# Patient Record
Sex: Female | Born: 1950 | Race: Black or African American | Hispanic: No | Smoking: Never smoker
Health system: Southern US, Community
[De-identification: ages and names within clinical notes are randomized; demographics above are authoritative.]

## PROBLEM LIST (undated history)

## (undated) DIAGNOSIS — I10 Essential (primary) hypertension: Secondary | ICD-10-CM

## (undated) DIAGNOSIS — C50919 Malignant neoplasm of unspecified site of unspecified female breast: Secondary | ICD-10-CM

## (undated) HISTORY — PX: ABDOMINAL HYSTERECTOMY: SHX81

## (undated) HISTORY — PX: HAND SURGERY: SHX662

---

## 2017-01-04 ENCOUNTER — Emergency Department: Payer: Medicare Other

## 2017-01-04 ENCOUNTER — Inpatient Hospital Stay
Admission: EM | Admit: 2017-01-04 | Discharge: 2017-01-06 | DRG: 641 | Disposition: A | Discharge status: Home / Self Care | Payer: Medicare Other | Attending: Internal Medicine | Admitting: Internal Medicine

## 2017-01-04 ENCOUNTER — Inpatient Hospital Stay: Payer: Medicare Other

## 2017-01-04 ENCOUNTER — Encounter: Payer: Self-pay | Admitting: Emergency Medicine

## 2017-01-04 DIAGNOSIS — Z79899 Other long term (current) drug therapy: Secondary | ICD-10-CM | POA: Diagnosis not present

## 2017-01-04 DIAGNOSIS — I214 Non-ST elevation (NSTEMI) myocardial infarction: Secondary | ICD-10-CM | POA: Diagnosis present

## 2017-01-04 DIAGNOSIS — R197 Diarrhea, unspecified: Secondary | ICD-10-CM | POA: Diagnosis present

## 2017-01-04 DIAGNOSIS — N179 Acute kidney failure, unspecified: Secondary | ICD-10-CM | POA: Diagnosis present

## 2017-01-04 DIAGNOSIS — E860 Dehydration: Principal | ICD-10-CM | POA: Diagnosis present

## 2017-01-04 DIAGNOSIS — R778 Other specified abnormalities of plasma proteins: Secondary | ICD-10-CM

## 2017-01-04 DIAGNOSIS — R7989 Other specified abnormal findings of blood chemistry: Secondary | ICD-10-CM

## 2017-01-04 DIAGNOSIS — D72829 Elevated white blood cell count, unspecified: Secondary | ICD-10-CM | POA: Diagnosis present

## 2017-01-04 DIAGNOSIS — R55 Syncope and collapse: Secondary | ICD-10-CM

## 2017-01-04 DIAGNOSIS — R748 Abnormal levels of other serum enzymes: Secondary | ICD-10-CM | POA: Diagnosis not present

## 2017-01-04 DIAGNOSIS — C50919 Malignant neoplasm of unspecified site of unspecified female breast: Secondary | ICD-10-CM | POA: Diagnosis present

## 2017-01-04 DIAGNOSIS — Z7982 Long term (current) use of aspirin: Secondary | ICD-10-CM | POA: Diagnosis not present

## 2017-01-04 DIAGNOSIS — R079 Chest pain, unspecified: Secondary | ICD-10-CM

## 2017-01-04 DIAGNOSIS — I10 Essential (primary) hypertension: Secondary | ICD-10-CM | POA: Diagnosis present

## 2017-01-04 HISTORY — DX: Malignant neoplasm of unspecified site of unspecified female breast: C50.919

## 2017-01-04 HISTORY — DX: Essential (primary) hypertension: I10

## 2017-01-04 LAB — CBC
HCT: 28.4 % — ABNORMAL LOW (ref 35.0–47.0)
HEMOGLOBIN: 9.4 g/dL — AB (ref 12.0–16.0)
MCH: 30.4 pg (ref 26.0–34.0)
MCHC: 33 g/dL (ref 32.0–36.0)
MCV: 92 fL (ref 80.0–100.0)
Platelets: 148 10*3/uL — ABNORMAL LOW (ref 150–440)
RBC: 3.09 MIL/uL — ABNORMAL LOW (ref 3.80–5.20)
RDW: 20 % — AB (ref 11.5–14.5)
WBC: 22 10*3/uL — ABNORMAL HIGH (ref 3.6–11.0)

## 2017-01-04 LAB — COMPREHENSIVE METABOLIC PANEL
ALK PHOS: 95 U/L (ref 38–126)
ALT: 17 U/L (ref 14–54)
ANION GAP: 9 (ref 5–15)
AST: 24 U/L (ref 15–41)
Albumin: 3.4 g/dL — ABNORMAL LOW (ref 3.5–5.0)
BUN: 17 mg/dL (ref 6–20)
CALCIUM: 8.6 mg/dL — AB (ref 8.9–10.3)
CO2: 22 mmol/L (ref 22–32)
Chloride: 104 mmol/L (ref 101–111)
Creatinine, Ser: 1.47 mg/dL — ABNORMAL HIGH (ref 0.44–1.00)
GFR calc non Af Amer: 36 mL/min — ABNORMAL LOW (ref >60)
GFR, EST AFRICAN AMERICAN: 41 mL/min — AB (ref >60)
Glucose, Bld: 168 mg/dL — ABNORMAL HIGH (ref 65–99)
POTASSIUM: 4 mmol/L (ref 3.5–5.1)
SODIUM: 135 mmol/L (ref 135–145)
TOTAL PROTEIN: 6.8 g/dL (ref 6.5–8.1)
Total Bilirubin: 1 mg/dL (ref 0.3–1.2)

## 2017-01-04 LAB — LIPID PANEL
CHOLESTEROL: 144 mg/dL (ref 0–200)
HDL: 39 mg/dL — ABNORMAL LOW (ref >40)
LDL Cholesterol: 92 mg/dL (ref 0–99)
Total CHOL/HDL Ratio: 3.7 RATIO
Triglycerides: 63 mg/dL (ref <150)
VLDL: 13 mg/dL (ref 0–40)

## 2017-01-04 LAB — PROTIME-INR
INR: 1.19
Prothrombin Time: 15.2 seconds (ref 11.4–15.2)

## 2017-01-04 LAB — HEPARIN LEVEL (UNFRACTIONATED)
Heparin Unfractionated: 0.77 IU/mL — ABNORMAL HIGH (ref 0.30–0.70)
Heparin Unfractionated: 0.54 IU/mL (ref 0.30–0.70)

## 2017-01-04 LAB — APTT: aPTT: 143 seconds — ABNORMAL HIGH (ref 24–36)

## 2017-01-04 LAB — URINALYSIS, COMPLETE (UACMP) WITH MICROSCOPIC
BILIRUBIN URINE: NEGATIVE
Glucose, UA: NEGATIVE mg/dL
Ketones, ur: NEGATIVE mg/dL
Leukocytes, UA: NEGATIVE
NITRITE: NEGATIVE
PH: 6 (ref 5.0–8.0)
Protein, ur: NEGATIVE mg/dL
SPECIFIC GRAVITY, URINE: 1.002 — AB (ref 1.005–1.030)

## 2017-01-04 LAB — TROPONIN I
Troponin I: 0.27 ng/mL (ref <0.03)
Troponin I: <0.03 ng/mL (ref <0.03)
Troponin I: 0.06 ng/mL (ref <0.03)
Troponin I: <0.03 ng/mL (ref <0.03)

## 2017-01-04 LAB — TSH: TSH: 1.619 u[IU]/mL (ref 0.350–4.500)

## 2017-01-04 MED ORDER — ACETAMINOPHEN 650 MG RE SUPP: 650.0000 mg | Freq: Four times a day (QID) | RECTAL | Status: DC | PRN

## 2017-01-04 MED ORDER — ONDANSETRON HCL 4 MG/2ML IJ SOLN: 4.0000 mg | Freq: Four times a day (QID) | INTRAMUSCULAR | Status: DC | PRN

## 2017-01-04 MED ORDER — AMLODIPINE BESYLATE 5 MG PO TABS
5.0000 mg | ORAL_TABLET | Freq: Every day | ORAL | Status: DC
  Administered 2017-01-04 – 2017-01-06 (×3): 5 mg via ORAL
  Filled 2017-01-04 (×3): qty 1

## 2017-01-04 MED ORDER — DEXAMETHASONE 4 MG PO TABS
8.0000 mg | ORAL_TABLET | Freq: Two times a day (BID) | ORAL | Status: DC
  Administered 2017-01-04 – 2017-01-05 (×3): 8 mg via ORAL
  Filled 2017-01-04 (×4): qty 2

## 2017-01-04 MED ORDER — ACETAMINOPHEN 325 MG PO TABS: 650.0000 mg | ORAL_TABLET | Freq: Four times a day (QID) | ORAL | Status: DC | PRN

## 2017-01-04 MED ORDER — DOCUSATE SODIUM 100 MG PO CAPS
100.0000 mg | ORAL_CAPSULE | Freq: Two times a day (BID) | ORAL | Status: DC
  Administered 2017-01-05 (×2): 100 mg via ORAL
  Filled 2017-01-04 (×3): qty 1

## 2017-01-04 MED ORDER — HEPARIN BOLUS VIA INFUSION
4000.0000 [IU] | Freq: Once | INTRAVENOUS | Status: AC
  Administered 2017-01-04: 4000 [IU] via INTRAVENOUS
  Filled 2017-01-04: qty 4000

## 2017-01-04 MED ORDER — ATORVASTATIN CALCIUM 20 MG PO TABS
40.0000 mg | ORAL_TABLET | Freq: Every day | ORAL | Status: DC
  Administered 2017-01-04 – 2017-01-06 (×3): 40 mg via ORAL
  Filled 2017-01-04 (×3): qty 2

## 2017-01-04 MED ORDER — ASPIRIN EC 81 MG PO TBEC
81.0000 mg | DELAYED_RELEASE_TABLET | Freq: Every day | ORAL | Status: DC
Start: 2017-01-04 — End: 2017-01-07
  Administered 2017-01-04 – 2017-01-06 (×3): 81 mg via ORAL
  Filled 2017-01-04 (×3): qty 1

## 2017-01-04 MED ORDER — SODIUM CHLORIDE 0.9 % IV BOLUS (SEPSIS)
1000.0000 mL | Freq: Once | INTRAVENOUS | Status: AC
  Administered 2017-01-04: 1000 mL via INTRAVENOUS

## 2017-01-04 MED ORDER — LORAZEPAM 1 MG PO TABS: 1.0000 mg | ORAL_TABLET | Freq: Four times a day (QID) | ORAL | Status: DC | PRN

## 2017-01-04 MED ORDER — ONDANSETRON HCL 4 MG PO TABS: 4.0000 mg | ORAL_TABLET | Freq: Four times a day (QID) | ORAL | Status: DC | PRN

## 2017-01-04 MED ORDER — HYDROCODONE-ACETAMINOPHEN 5-325 MG PO TABS: 1.0000 | ORAL_TABLET | Freq: Four times a day (QID) | ORAL | Status: DC | PRN

## 2017-01-04 MED ORDER — SODIUM CHLORIDE 0.9 % IV SOLN
INTRAVENOUS | Status: DC
  Administered 2017-01-04 – 2017-01-05 (×3): via INTRAVENOUS

## 2017-01-04 MED ORDER — HEPARIN (PORCINE) IN NACL 100-0.45 UNIT/ML-% IJ SOLN
1100.0000 [IU]/h | INTRAMUSCULAR | Status: DC
  Administered 2017-01-04: 1100 [IU]/h via INTRAVENOUS
  Filled 2017-01-04: qty 250

## 2017-01-04 NOTE — H&P (Signed)
Traci Mckinney is an 66 y.o. female.    Chief Complaint: Loss of consciousness HPI: The patient with past medical history of hypertension who is currently undergoing treatment for breast cancer presents to the emergency department after a syncopal episode. The patient was sitting at her electronic gaming continue when she began to feel weak and lightheaded. The patient remembers that her vision became blurry but does not have recollection of events thereafter until she regained consciousness. She denies feeling chest pain, shortness of breath or palpitations. She also denies vomiting or diaphoresis but admits to feeling nauseous. In the emergency department the patient was found to have an elevated troponin which prompted the emergency department staff to call the hospitalist service for admission.  Past Medical History:  Diagnosis Date  . Breast CA ([place])   . HTN (hypertension)     Past Surgical History:  Procedure Laterality Date  . ABDOMINAL HYSTERECTOMY    . HAND SURGERY      Family History  Problem Relation Age of Onset  . Diabetes Mellitus II Mother   . Hypertension Father    Social History:  reports that she has never smoked. She has never used smokeless tobacco. She reports that she does not drink alcohol or use drugs.  Allergies: No Known Allergies  Medications Prior to Admission  Medication Sig Dispense Refill  . amLODipine (NORVASC) 5 MG tablet Take 5 mg by mouth daily.    Marland Kitchen dexamethasone (DECADRON) 4 MG tablet Take 8 mg by mouth 2 (two) times daily. The day before and day after chemotherapy    . HYDROcodone-acetaminophen (NORCO/VICODIN) 5-325 MG tablet Take 1 tablet by mouth every 6 (six) hours as needed for pain.    Marland Kitchen LORazepam (ATIVAN) 1 MG tablet Take 1 mg by mouth every 6 (six) hours as needed for nausea/vomiting or sleep.    Marland Kitchen ondansetron (ZOFRAN) 8 MG tablet Take 8 mg by mouth every 6 (six) hours as needed for nausea.    . prochlorperazine (COMPAZINE) 10 MG tablet Take  10 mg by mouth every 6 (six) hours as needed for nausea/vomiting.      Results for orders placed or performed during the hospital encounter of 01/04/17 (from the past 48 hour(s))  CBC     Status: Abnormal   Collection Time: 01/04/17  1:20 AM  Result Value Ref Range   WBC 22.0 (H) 3.6 - 11.0 K/uL   RBC 3.09 (L) 3.80 - 5.20 MIL/uL   Hemoglobin 9.4 (L) 12.0 - 16.0 g/dL   HCT 28.4 (L) 35.0 - 47.0 %   MCV 92.0 80.0 - 100.0 fL   MCH 30.4 26.0 - 34.0 pg   MCHC 33.0 32.0 - 36.0 g/dL   RDW 20.0 (H) 11.5 - 14.5 %   Platelets 148 (L) 150 - 440 K/uL    Comment: PLATELET COUNT CONFIRMED BY SMEAR  Comprehensive metabolic panel     Status: Abnormal   Collection Time: 01/04/17  1:20 AM  Result Value Ref Range   Sodium 135 135 - 145 mmol/L   Potassium 4.0 3.5 - 5.1 mmol/L    Comment: HEMOLYSIS AT THIS LEVEL MAY AFFECT RESULT   Chloride 104 101 - 111 mmol/L   CO2 22 22 - 32 mmol/L   Glucose, Bld 168 (H) 65 - 99 mg/dL   BUN 17 6 - 20 mg/dL   Creatinine, Ser 1.47 (H) 0.44 - 1.00 mg/dL   Calcium 8.6 (L) 8.9 - 10.3 mg/dL   Total Protein 6.8 6.5 -  8.1 g/dL   Albumin 3.4 (L) 3.5 - 5.0 g/dL   AST 24 15 - 41 U/L   ALT 17 14 - 54 U/L   Alkaline Phosphatase 95 38 - 126 U/L   Total Bilirubin 1.0 0.3 - 1.2 mg/dL   GFR calc non Af Amer 36 (L) >60 mL/min   GFR calc Af Amer 41 (L) >60 mL/min    Comment: (NOTE) The eGFR has been calculated using the CKD EPI equation. This calculation has not been validated in all clinical situations. eGFR's persistently <60 mL/min signify possible Chronic Kidney Disease.    Anion gap 9 5 - 15  Troponin I     Status: Abnormal   Collection Time: 01/04/17  1:20 AM  Result Value Ref Range   Troponin I 0.27 (HH) <0.03 ng/mL    Comment: CRITICAL RESULT CALLED TO, READ BACK BY AND VERIFIED WITH Eloise Harman RN AT 0220 01/04/17 MSS.   Urinalysis, Complete w Microscopic     Status: Abnormal   Collection Time: 01/04/17  3:43 AM  Result Value Ref Range   Color, Urine  COLORLESS (A) YELLOW   APPearance CLEAR (A) CLEAR   Specific Gravity, Urine 1.002 (L) 1.005 - 1.030   pH 6.0 5.0 - 8.0   Glucose, UA NEGATIVE NEGATIVE mg/dL   Hgb urine dipstick SMALL (A) NEGATIVE   Bilirubin Urine NEGATIVE NEGATIVE   Ketones, ur NEGATIVE NEGATIVE mg/dL   Protein, ur NEGATIVE NEGATIVE mg/dL   Nitrite NEGATIVE NEGATIVE   Leukocytes, UA NEGATIVE NEGATIVE   RBC / HPF 0-5 0 - 5 RBC/hpf   WBC, UA 0-5 0 - 5 WBC/hpf   Bacteria, UA RARE (A) NONE SEEN   Squamous Epithelial / LPF 0-5 (A) NONE SEEN  Troponin I     Status: None   Collection Time: 01/04/17  4:40 AM  Result Value Ref Range   Troponin I <0.03 <0.03 ng/mL  TSH     Status: None   Collection Time: 01/04/17  5:54 AM  Result Value Ref Range   TSH 1.619 0.350 - 4.500 uIU/mL    Comment: Performed by a 3rd Generation assay with a functional sensitivity of <=0.01 uIU/mL.  APTT     Status: Abnormal   Collection Time: 01/04/17  5:54 AM  Result Value Ref Range   aPTT 143 (H) 24 - 36 seconds    Comment:        IF BASELINE aPTT IS ELEVATED, SUGGEST PATIENT RISK ASSESSMENT BE USED TO DETERMINE APPROPRIATE ANTICOAGULANT THERAPY.   Protime-INR     Status: None   Collection Time: 01/04/17  5:54 AM  Result Value Ref Range   Prothrombin Time 15.2 11.4 - 15.2 seconds   INR 1.19   Heparin level (unfractionated)     Status: Abnormal   Collection Time: 01/04/17  5:54 AM  Result Value Ref Range   Heparin Unfractionated 0.77 (H) 0.30 - 0.70 IU/mL    Comment:        IF HEPARIN RESULTS ARE BELOW EXPECTED VALUES, AND PATIENT DOSAGE HAS BEEN CONFIRMED, SUGGEST FOLLOW UP TESTING OF ANTITHROMBIN III LEVELS.    Ct Head Wo Contrast  Result Date: 01/04/2017 CLINICAL DATA:  Initial evaluation for acute syncope. EXAM: CT HEAD WITHOUT CONTRAST TECHNIQUE: Contiguous axial images were obtained from the base of the skull through the vertex without intravenous contrast. COMPARISON:  None. FINDINGS: Brain: Cerebral volume within normal  limits for patient age. No evidence for acute intracranial hemorrhage. No findings to suggest acute large  vessel territory infarct. No mass lesion, midline shift, or mass effect. Ventricles are normal in size without evidence for hydrocephalus. No extra-axial fluid collection identified. Vascular: No hyperdense vessel identified. Skull: Scalp soft tissues demonstrate no acute abnormality.Calvarium intact. Sinuses/Orbits: Globes and orbital soft tissues are within normal limits. Visualized paranasal sinuses are clear. Trace left mastoid effusion noted. IMPRESSION: Normal head CT.  No acute intracranial process identified. Electronically Signed   By: Jeannine Boga M.D.   On: 01/04/2017 02:07    Review of Systems  Constitutional: Negative for chills and fever.  HENT: Negative for sore throat and tinnitus.   Eyes: Negative for blurred vision and redness.  Respiratory: Negative for cough and shortness of breath.   Cardiovascular: Negative for chest pain, palpitations, orthopnea and PND.  Gastrointestinal: Negative for abdominal pain, diarrhea, nausea and vomiting.  Genitourinary: Negative for dysuria, frequency and urgency.  Musculoskeletal: Negative for joint pain and myalgias.  Skin: Negative for rash.       No lesions  Neurological: Positive for weakness. Negative for speech change and focal weakness.  Endo/Heme/Allergies: Does not bruise/bleed easily.       No temperature intolerance  Psychiatric/Behavioral: Negative for depression and suicidal ideas.    Blood pressure 133/63, pulse 70, temperature 97.9 F (36.6 C), temperature source Oral, resp. rate 18, height 5' 6"  (1.676 m), weight 93.1 kg (205 lb 4.8 oz), SpO2 97 %. Physical Exam  Vitals reviewed. Constitutional: She is oriented to person, place, and time. She appears well-developed and well-nourished. No distress.  HENT:  Head: Normocephalic and atraumatic.  Eyes: Conjunctivae and EOM are normal. Pupils are equal, round, and  reactive to light. No scleral icterus.  Neck: Normal range of motion. Neck supple. No JVD present. No tracheal deviation present. No thyromegaly present.  Cardiovascular: Normal rate, regular rhythm and normal heart sounds.  Exam reveals no gallop and no friction rub.   No murmur heard. Respiratory: Effort normal and breath sounds normal.  GI: Soft. Bowel sounds are normal. She exhibits no distension. There is no tenderness.  Genitourinary:  Genitourinary Comments: Deferred  Musculoskeletal: Normal range of motion. She exhibits no edema.  Lymphadenopathy:    She has no cervical adenopathy.  Neurological: She is alert and oriented to person, place, and time. No cranial nerve deficit. She exhibits normal muscle tone.  Skin: Skin is warm and dry. No rash noted. No erythema.  Psychiatric: She has a normal mood and affect. Her behavior is normal. Judgment and thought content normal.     Assessment/Plan This is a 66 year old female admitted for NSTEMI. 1. NSTEMI: Elevated troponin. Continue follow cardiac biomarkers. Monitor telemetry. Cardiology has been consulted. 2. Acute kidney injury: Hydrate with intravenous fluid. Avoid nephrotoxic agents. 3. Essential hypertension: Controlled; continue amlodipine 4. Breast cancer: Undergoing chemotherapy; last treatment June 6. Includes dexamethasone. Explains leukocytosis. 5. DVT prophylaxis: Therapeutic heparin 6. GI prophylaxis: None The patient is a full code. Time spent on admission orders and patient care approximately 45 minutes  Harrie Foreman, MD 01/04/2017, 8:26 AM

## 2017-01-04 NOTE — ED Provider Notes (Signed)
[facility] Emergency Department Provider Note   ____________________________________________   First MD Initiated Contact with Patient 01/04/17 0105     (approximate)  I have reviewed the triage vital signs and the nursing notes.   HISTORY  Chief Complaint Loss of Consciousness    HPI Traci Mckinney is a 66 y.o. female who comes into the hospital today with a syncopal episode. EMS reports that the patient was at the [facility] and took a break. She sat in chair and seemed to drift off. The patient reports that she passed out. She states that she started feeling a little dizzy and was complaining of dizziness with EMS. The patient's blood sugar was 161. She is currently undergoing treatment for breast cancer with chemotherapy. The patient reports that she was just feeling weak and dizzy. She is here from [place]. The patient has been having diarrhea she reports for multiple weeks. The patient states it started after her last chemotherapy which was around June 6. The patient reports that she's had intermittent and varying amounts of diarrhea. She states that she has been drinking but not enough. Should the patient denies any vomiting, chest pain, shortness of breath. She's had some nausea and abdominal cramps. The patient feels lightheaded and denies any room spinning. She's also had some blurred vision without headache. The patient is here today for evaluation.The patient was sitting in a chair when she syncopized.   Past Medical History:  Diagnosis Date  . Breast CA ([place])   . HTN (hypertension)     Patient Active Problem List   Diagnosis Date Noted  . NSTEMI (non-ST elevated myocardial infarction) ([place]) 01/04/2017    Past Surgical History:  Procedure Laterality Date  . ABDOMINAL HYSTERECTOMY    . HAND SURGERY      Prior to Admission medications   Medication Sig Start Date End Date Taking? Authorizing Provider  amLODipine (NORVASC) 5 MG tablet Take  5 mg by mouth daily.   Yes [provider]  dexamethasone (DECADRON) 4 MG tablet Take 8 mg by mouth 2 (two) times daily. The day before and day after chemotherapy   Yes [provider]  HYDROcodone-acetaminophen (NORCO/VICODIN) 5-325 MG tablet Take 1 tablet by mouth every 6 (six) hours as needed for pain.   Yes [provider]  LORazepam (ATIVAN) 1 MG tablet Take 1 mg by mouth every 6 (six) hours as needed for nausea/vomiting or sleep.   Yes [provider]  ondansetron (ZOFRAN) 8 MG tablet Take 8 mg by mouth every 6 (six) hours as needed for nausea.   Yes [provider]  prochlorperazine (COMPAZINE) 10 MG tablet Take 10 mg by mouth every 6 (six) hours as needed for nausea/vomiting.   Yes [provider]    Allergies Patient has no known allergies.  No family history on file.  Social History Social History  Substance Use Topics  . Smoking status: Never Smoker  . Smokeless tobacco: Never Used  . Alcohol use No    Review of Systems  Constitutional: No fever/chills Eyes: Blurred vision ENT: No sore throat. Cardiovascular: Denies chest pain. Respiratory: Denies shortness of breath. Gastrointestinal: Diarrhea and  abdominal cramping, nausea, no vomiting.   Genitourinary: Negative for dysuria. Musculoskeletal: Negative for back pain. Skin: Negative for rash. Neurological: Syncope.   ____________________________________________   PHYSICAL EXAM:  VITAL SIGNS: ED Triage Vitals  Enc Vitals Group     BP 01/04/17 0117 (!) 94/58     Pulse Rate 01/04/17  0117 94     Resp 01/04/17 0117 18     Temp 01/04/17 0117 98.1 F (36.7 C)     Temp Source 01/04/17 0117 Oral     SpO2 01/04/17 0117 98 %     Weight 01/04/17 0119 208 lb (94.3 kg)     Height 01/04/17 0119 5\' 6"  (1.676 m)     Head Circumference --      Peak Flow --      Pain Score --      Pain Loc --      Pain Edu? --      Excl. in [place]? --     Constitutional: Alert and  oriented. Well appearing and in Mild distress. Eyes: Conjunctivae are normal. PERRL. EOMI. Head: Atraumatic. Nose: No congestion/rhinnorhea. Mouth/Throat: Mucous membranes are moist.  Oropharynx non-erythematous. Cardiovascular: Normal rate, regular rhythm. Grossly normal heart sounds.  Good peripheral circulation. Respiratory: Normal respiratory effort.  No retractions. Lungs CTAB. Gastrointestinal: Soft and nontender. No distention. Positive bowel sounds Musculoskeletal: No lower extremity tenderness nor edema.   Neurologic:  Normal speech and language. Cranial nerves II through XII are grossly intact with no focal motor or neuro deficits Skin:  Skin is warm, dry and intact.  Psychiatric: Mood and affect are normal.   ____________________________________________   LABS (all labs ordered are listed, but only abnormal results are displayed)  Labs Reviewed  CBC - Abnormal; Notable for the following:       Result Value   WBC 22.0 (*)    RBC 3.09 (*)    Hemoglobin 9.4 (*)    HCT 28.4 (*)    RDW 20.0 (*)    All other components within normal limits  COMPREHENSIVE METABOLIC PANEL - Abnormal; Notable for the following:    Glucose, Bld 168 (*)    Creatinine, Ser 1.47 (*)    Calcium 8.6 (*)    Albumin 3.4 (*)    GFR calc non Af Amer 36 (*)    GFR calc Af Amer 41 (*)    All other components within normal limits  TROPONIN I - Abnormal; Notable for the following:    Troponin I 0.27 (*)    All other components within normal limits  URINALYSIS, COMPLETE (UACMP) WITH MICROSCOPIC   ____________________________________________  EKG  ED ECG REPORT I, Loney Hering, the attending physician, personally viewed and interpreted this ECG.   Date: 01/04/2017  EKG Time: 122  Rate: 90  Rhythm: normal sinus rhythm  Axis: normal  Intervals:none  ST&T Change: flipped t waves in lead avl  ____________________________________________  RADIOLOGY  Ct Head Wo Contrast  Result Date:  01/04/2017 CLINICAL DATA:  Initial evaluation for acute syncope. EXAM: CT HEAD WITHOUT CONTRAST TECHNIQUE: Contiguous axial images were obtained from the base of the skull through the vertex without intravenous contrast. COMPARISON:  None. FINDINGS: Brain: Cerebral volume within normal limits for patient age. No evidence for acute intracranial hemorrhage. No findings to suggest acute large vessel territory infarct. No mass lesion, midline shift, or mass effect. Ventricles are normal in size without evidence for hydrocephalus. No extra-axial fluid collection identified. Vascular: No hyperdense vessel identified. Skull: Scalp soft tissues demonstrate no acute abnormality.Calvarium intact. Sinuses/Orbits: Globes and orbital soft tissues are within normal limits. Visualized paranasal sinuses are clear. Trace left mastoid effusion noted. IMPRESSION: Normal head CT.  No acute intracranial process identified. Electronically Signed   By: Jeannine Boga M.D.   On: 01/04/2017 02:07    ____________________________________________  PROCEDURES  Procedure(s) performed: None  Procedures  Critical Care performed: No  ____________________________________________   INITIAL IMPRESSION / ASSESSMENT AND PLAN / ED COURSE  Pertinent labs & imaging results that were available during my care of the patient were reviewed by me and considered in my medical decision making (see chart for details).  This is a 66 year old female who comes into the hospital today with syncope. The patient reports that she has been having some diarrhea due to her chemotherapy. She was not orthostatic. I will give the patient a liter of normal saline for further evaluation and I will await the results of her blood work and imaging studies.     Patient's CT scan is unremarkable but her white blood cell count is elevated and her troponin is 0.27. Given the patient's syncopal event I will admit her to the hospitalist service for  further evaluation of a cardiac cause of her syncope. The patient did receive a liter of normal saline. She did also discuss with the nurse that she had some shoulder cramping before she passed out. ____________________________________________   FINAL CLINICAL IMPRESSION(S) / ED DIAGNOSES  Final diagnoses:  Syncope, unspecified syncope type  Elevated troponin      NEW MEDICATIONS STARTED DURING THIS VISIT:  New Prescriptions   No medications on file     Note:  This document was prepared using Dragon voice recognition software and may include unintentional dictation errors.    Loney Hering, MD 01/04/17 [phone]

## 2017-01-04 NOTE — Plan of Care (Signed)
Problem: Education: Goal: Knowledge of [facility] Education information/materials will improve Outcome: Completed/Met Date Met: 01/04/17 Pt admitted from ED with syncopal episode and positive troponin. Tele box number 28, verified by Gaetano Net, NT. Skin intact, just unaccessed port to right upper chest, skin verified by Levester Fresh, RN. No complaints of pain. NS infusing @ 125 & heparin gtt infusing @ 11.   Problem: Pain Managment: Goal: General experience of comfort will improve Outcome: Progressing No complaints of pain since pt was brought up from the ED, will continue to monitor.  Problem: Bowel/Gastric: Goal: Will not experience complications related to bowel motility Outcome: Progressing Pt with multiple BM's this AM, pt states this is normal for her since she started her chemo.

## 2017-01-04 NOTE — Progress Notes (Signed)
Back from xray

## 2017-01-04 NOTE — Plan of Care (Signed)
Problem: Pain Managment: Goal: General experience of comfort will improve Outcome: Progressing No complaints of pain this shift

## 2017-01-04 NOTE — Progress Notes (Signed)
ANTICOAGULATION CONSULT NOTE - Initial Consult  Pharmacy Consult for heparin dosing Indication: chest pain/ACS  No Known Allergies  Patient Measurements: Height: 5\' 6"  (167.6 cm) Weight: 208 lb (94.3 kg) IBW/kg (Calculated) : 59.3 Heparin Dosing Weight: 94.3 kg  Vital Signs: Temp: 97.9 F (36.6 C) (06/19 0459) Temp Source: Oral (06/19 0459) BP: 133/63 (06/19 0459) Pulse Rate: 70 (06/19 0459)  Labs:  Recent Labs  01/04/17 0120 01/04/17 0440  HGB 9.4*  --   HCT 28.4*  --   PLT 148*  --   CREATININE 1.47*  --   TROPONINI 0.27* <0.03    Estimated Creatinine Clearance: 43 mL/min (A) (by C-G formula based on SCr of 1.47 mg/dL (H)).   Medical History: Past Medical History:  Diagnosis Date  . Breast CA ([place])   . HTN (hypertension)     Medications:  Scheduled:  . amLODipine  5 mg Oral Daily  . dexamethasone  8 mg Oral BID  . docusate sodium  100 mg Oral BID  . heparin  4,000 Units Intravenous Once    Assessment: Pt. Admitted for syncope and found to have trops up to 0.27, second trop < 0.03. Starting heparin drip  Goal of Therapy:  Heparin level 0.3-0.7 units/ml Monitor platelets by anticoagulation protocol: Yes   Plan:  Give 4000 units bolus x 1  Will start heparin drip @ 1100 units/hr rate. Will check HL @ 1330 6/19. Baseline labs ordered.  Tobie Lords, PharmD, BCPS Clinical Pharmacist 01/04/2017

## 2017-01-04 NOTE — Care Management (Addendum)
Spoke with patient regarding discharge planning and PCP. She states that she has Medicaid and a Physiological scientist. She states her physician is [place] Tye [place] of [place] is [place] she's from. She receives her breast cancer treatment from [facility].She is here with her best friend - Mingo Amber which will provide transportation to home when discharged. Patient denies RNCM needs.

## 2017-01-04 NOTE — Consult Note (Signed)
[facility] Cardiology  CARDIOLOGY CONSULT NOTE  Patient ID: Traci Mckinney MRN: [id number] DOB/AGE: 11-05-1950 66 y.o.  Admit date: 01/04/2017 Referring Physician [facility] Primary Cardiologist None per patient Reason for Consultation Elevated troponin  HPI: 66 year old female referred for elevated troponin. The patient has a history of hypertension and breast cancer, currently undergoing chemotherapy, most recent treatment was on June 6. The patient reports being in her normal state of health late last night while at a sweepstakes center, and suddenly began to feel weak and had blurry vision. She is unsure if she completely lost consciousness, but states the episode lasted only about 1 minute. She denies experiencing chest pain, shortness of breath, palpitations, heart racing, nausea, or diaphoresis. She states that while the paramedics were carrying her on the stretcher, she developed a cramp under her left scapula, which resolved shortly after moving from the stretcher. Admission labs notable for borderline elevated troponin of 0.27, followed by <0.03, hemoglobin 9.4, hematocrit 28.4, platelets 148000, creatinine 1.47, and GFR 41. ECG revealed normal sinus rhythm at a rate of 90 bpm with minimal ST elevation in inferior leads. The patient states she had a similar episode in March of this year, admitted to [facility], [facility], [facility], and the episode was felt to be due to vasovagal syncope or dehydration. Recent echocardiogram on 10/19/16 revealed normal left ventricular function with LVEF 55-60%. Currently, the patient reports feeling better. She denies chest pain, shortness of breath, palpitations, heart racing, lightheadedness, or dizziness. She denies a history of prior MI, cardiac catheterization, arrhythmia, valvular heart disease, or stroke.  Review of systems complete and found to be negative unless listed above     Past Medical History:  Diagnosis Date  . Breast CA ([place])   . HTN (hypertension)      Past Surgical History:  Procedure Laterality Date  . ABDOMINAL HYSTERECTOMY    . HAND SURGERY      Prescriptions Prior to Admission  Medication Sig Dispense Refill Last Dose  . amLODipine (NORVASC) 5 MG tablet Take 5 mg by mouth daily.   01/03/2017  . dexamethasone (DECADRON) 4 MG tablet Take 8 mg by mouth 2 (two) times daily. The day before and day after chemotherapy   01/03/2017  . HYDROcodone-acetaminophen (NORCO/VICODIN) 5-325 MG tablet Take 1 tablet by mouth every 6 (six) hours as needed for pain.   01/03/2017  . LORazepam (ATIVAN) 1 MG tablet Take 1 mg by mouth every 6 (six) hours as needed for nausea/vomiting or sleep.   01/03/2017  . ondansetron (ZOFRAN) 8 MG tablet Take 8 mg by mouth every 6 (six) hours as needed for nausea.   01/03/2017  . prochlorperazine (COMPAZINE) 10 MG tablet Take 10 mg by mouth every 6 (six) hours as needed for nausea/vomiting.   01/03/2017   Social History   Social History  . Marital status: Unknown    Spouse name: N/A  . Number of children: N/A  . Years of education: N/A   Occupational History  . Not on file.   Social History Main Topics  . Smoking status: Never Smoker  . Smokeless tobacco: Never Used  . Alcohol use No  . Drug use: No  . Sexual activity: Not on file   Other Topics Concern  . Not on file   Social History Narrative  . No narrative on file    Family History  Problem Relation Age of Onset  . Diabetes Mellitus II Mother   . Hypertension Father       Review  of systems complete and found to be negative unless listed above      PHYSICAL EXAM  General: Well developed, well nourished, in no acute distress HEENT:  Normocephalic and atramatic Neck:  No JVD.  Lungs: Clear bilaterally to auscultation, normal effort of breathing Heart: HRRR . Normal S1 and S2 without gallops or murmurs.  Abdomen: Bowel sounds are positive, abdomen soft and non-tender  Msk:  Back normal, able to sit upright in bed without  difficulty. Extremities: No clubbing, cyanosis or edema.   Neuro: Alert and oriented X 3. Psych:  Good affect, responds appropriately  Labs:   Lab Results  Component Value Date   WBC 22.0 (H) 01/04/2017   HGB 9.4 (L) 01/04/2017   HCT 28.4 (L) 01/04/2017   MCV 92.0 01/04/2017   PLT 148 (L) 01/04/2017    Recent Labs Lab 01/04/17 0120  NA 135  K 4.0  CL 104  CO2 22  BUN 17  CREATININE 1.47*  CALCIUM 8.6*  PROT 6.8  BILITOT 1.0  ALKPHOS 95  ALT 17  AST 24  GLUCOSE 168*   Lab Results  Component Value Date   TROPONINI <0.03 01/04/2017   No results found for: CHOL No results found for: HDL No results found for: LDLCALC No results found for: TRIG No results found for: CHOLHDL No results found for: LDLDIRECT    Radiology: Dg Chest 2 View  Result Date: 01/04/2017 CLINICAL DATA:  Syncopal episode this morning. Patient is undergoing chemotherapy for breast malignancy. History of NSTEMI and hypertension. Nonsmoker. EXAM: CHEST  2 VIEW COMPARISON:  PET-CT study of November 08, 2016 and chest x-ray of September 17, 2016 FINDINGS: The lungs are adequately inflated. There are coarse lung markings at the left lung base. There is no pleural effusion or pneumothorax. The heart is top-normal in size. The pulmonary vascularity is normal. There is tortuosity of the ascending and descending thoracic aorta. The power port catheter tip projects over the proximal third of the SVC. There is multilevel degenerative disc disease with bridging endplate osteophytes of the mid and lower thoracic spine. IMPRESSION: There is no acute pneumonia nor CHF. I cannot exclude minimal atelectasis at the left lung base. Thoracic aortic atherosclerosis. Electronically Signed   By: David  Martinique M.D.   On: 01/04/2017 08:37   Ct Head Wo Contrast  Result Date: 01/04/2017 CLINICAL DATA:  Initial evaluation for acute syncope. EXAM: CT HEAD WITHOUT CONTRAST TECHNIQUE: Contiguous axial images were obtained from the base of the  skull through the vertex without intravenous contrast. COMPARISON:  None. FINDINGS: Brain: Cerebral volume within normal limits for patient age. No evidence for acute intracranial hemorrhage. No findings to suggest acute large vessel territory infarct. No mass lesion, midline shift, or mass effect. Ventricles are normal in size without evidence for hydrocephalus. No extra-axial fluid collection identified. Vascular: No hyperdense vessel identified. Skull: Scalp soft tissues demonstrate no acute abnormality.Calvarium intact. Sinuses/Orbits: Globes and orbital soft tissues are within normal limits. Visualized paranasal sinuses are clear. Trace left mastoid effusion noted. IMPRESSION: Normal head CT.  No acute intracranial process identified. Electronically Signed   By: Jeannine Boga M.D.   On: 01/04/2017 02:07    EKG: Normal sinus rhythm, 60 bpm  ASSESSMENT AND PLAN:  1. Syncope, with prodromal weakness and blurred vision, with similar episode 09/2016, felt to be due to dehydration vs. Vasovagal. Recent echocardiogram on 10/19/16 revealed normal left ventricular function with LVEF 55-60%. 2. Borderline elevated initial troponin of 0.27, followed by <0.03,  then 0.06, in the absence of chest pain, with minimal ST elevation in inferior leads, which is changed from prior ECG, no history of CAD. 3. Breast cancer, currently undergoing chemotherapy, last treatment on June 6  Recommendations: 1. Continue current therapy. 2. Continue to cycle cardiac enzymes and monitor on telemetry 3. Nuclear stress test in the morning 4. Discontinue heparin 5.  Further recommendations pending patient's initial course    Signed: Clabe Seal, Hershal Coria 01/04/2017, 10:07 AM

## 2017-01-04 NOTE — ED Triage Notes (Signed)
Pt presents to ED via EMS from a sweepstakes building where she had been gambling. Pt states she had sudden onset of dizziness and sat down. Once sitting pt was said to have had a syncopal episode for a few seconds. undergoing tx for breast CA currently and reports having frequent diarrhea since her last chemo which was Friday. Pt eyes closed during triage but answering questions appropriately. Only pain is "cramp" in her "left shoulder blade". Denies nausea or sob.

## 2017-01-04 NOTE — Progress Notes (Signed)
To xray via bed 

## 2017-01-04 NOTE — ED Notes (Signed)
Pt to CT

## 2017-01-04 NOTE — Progress Notes (Signed)
[place] at [place] NAME: Traci Mckinney    MR#:  [id number]  DATE OF BIRTH:  [birth year]  SUBJECTIVE:  CHIEF COMPLAINT:   Chief Complaint  Patient presents with  . Loss of Consciousness    History of breast cancer on chemotherapy, came with left-sided chest pain troponin was slightly elevated so admitted with IV heparin drip. On further follow-up her troponin remains stable. Still have some tightness, mostly muscular type of pain on the left side of back pain.  REVIEW OF SYSTEMS:  CONSTITUTIONAL: No fever, fatigue or weakness.  EYES: No blurred or double vision.  EARS, NOSE, AND THROAT: No tinnitus or ear pain.  RESPIRATORY: No cough, shortness of breath, wheezing or hemoptysis.  CARDIOVASCULAR: No chest pain, orthopnea, edema.  GASTROINTESTINAL: No nausea, vomiting, diarrhea or abdominal pain.  GENITOURINARY: No dysuria, hematuria.  ENDOCRINE: No polyuria, nocturia,  HEMATOLOGY: No anemia, easy bruising or bleeding SKIN: No rash or lesion. MUSCULOSKELETAL: No joint pain or arthritis.   NEUROLOGIC: No tingling, numbness, weakness.  PSYCHIATRY: No anxiety or depression.   ROS  DRUG ALLERGIES:  No Known Allergies  VITALS:  Blood pressure 127/62, pulse 92, temperature (!) 100.9 F (38.3 C), temperature source Oral, resp. rate 18, height 5\' 6"  (1.676 m), weight 93.1 kg (205 lb 4.8 oz), SpO2 93 %.  PHYSICAL EXAMINATION:  GENERAL:  66 y.o.-year-old patient lying in the bed with no acute distress.  EYES: Pupils equal, round, reactive to light and accommodation. No scleral icterus. Extraocular muscles intact.  HEENT: Head atraumatic, normocephalic. Oropharynx and nasopharynx clear.  NECK:  Supple, no jugular venous distention. No thyroid enlargement, no tenderness.  LUNGS: Normal breath sounds bilaterally, no wheezing, rales,rhonchi or crepitation. No use of accessory muscles of respiration.  CARDIOVASCULAR: S1, S2 normal. No murmurs, rubs,  or gallops.  ABDOMEN: Soft, nontender, nondistended. Bowel sounds present. No organomegaly or mass.  EXTREMITIES: No pedal edema, cyanosis, or clubbing.  NEUROLOGIC: Cranial nerves II through XII are intact. Muscle strength 5/5 in all extremities. Sensation intact. Gait not checked.  PSYCHIATRIC: The patient is alert and oriented x 3.  SKIN: No obvious rash, lesion, or ulcer.   Physical Exam LABORATORY PANEL:   CBC  Recent Labs Lab 01/04/17 0120  WBC 22.0*  HGB 9.4*  HCT 28.4*  PLT 148*   ------------------------------------------------------------------------------------------------------------------  Chemistries   Recent Labs Lab 01/04/17 0120  NA 135  K 4.0  CL 104  CO2 22  GLUCOSE 168*  BUN 17  CREATININE 1.47*  CALCIUM 8.6*  AST 24  ALT 17  ALKPHOS 95  BILITOT 1.0   ------------------------------------------------------------------------------------------------------------------  Cardiac Enzymes  Recent Labs Lab 01/04/17 0440 01/04/17 1038  TROPONINI <0.03 0.06*   ------------------------------------------------------------------------------------------------------------------  RADIOLOGY:  Dg Chest 2 View  Result Date: 01/04/2017 CLINICAL DATA:  Syncopal episode this morning. Patient is undergoing chemotherapy for breast malignancy. History of NSTEMI and hypertension. Nonsmoker. EXAM: CHEST  2 VIEW COMPARISON:  PET-CT study of November 08, 2016 and chest x-ray of September 17, 2016 FINDINGS: The lungs are adequately inflated. There are coarse lung markings at the left lung base. There is no pleural effusion or pneumothorax. The heart is top-normal in size. The pulmonary vascularity is normal. There is tortuosity of the ascending and descending thoracic aorta. The power port catheter tip projects over the proximal third of the SVC. There is multilevel degenerative disc disease with bridging endplate osteophytes of the mid and lower thoracic spine. IMPRESSION: There  is no  acute pneumonia nor CHF. I cannot exclude minimal atelectasis at the left lung base. Thoracic aortic atherosclerosis. Electronically Signed   By: David  Martinique M.D.   On: 01/04/2017 08:37   Ct Head Wo Contrast  Result Date: 01/04/2017 CLINICAL DATA:  Initial evaluation for acute syncope. EXAM: CT HEAD WITHOUT CONTRAST TECHNIQUE: Contiguous axial images were obtained from the base of the skull through the vertex without intravenous contrast. COMPARISON:  None. FINDINGS: Brain: Cerebral volume within normal limits for patient age. No evidence for acute intracranial hemorrhage. No findings to suggest acute large vessel territory infarct. No mass lesion, midline shift, or mass effect. Ventricles are normal in size without evidence for hydrocephalus. No extra-axial fluid collection identified. Vascular: No hyperdense vessel identified. Skull: Scalp soft tissues demonstrate no acute abnormality.Calvarium intact. Sinuses/Orbits: Globes and orbital soft tissues are within normal limits. Visualized paranasal sinuses are clear. Trace left mastoid effusion noted. IMPRESSION: Normal head CT.  No acute intracranial process identified. Electronically Signed   By: Jeannine Boga M.D.   On: 01/04/2017 02:07    ASSESSMENT AND PLAN:   Active Problems:   NSTEMI (non-ST elevated myocardial infarction) (HCC)  * Non-ST elevation MI- 3 suspected on admission but likely it is ruled out as 3 troponin consecutively is not very high.   Currently on IV heparin drip and cardiology consult was called in, I will wait for them to follow on the patient.   Meanwhile check the peak panel and start on aspirin and atorvastatin.  *  Acute kidney injury: Hydrate with intravenous fluid. Avoid nephrotoxic agents. * Essential hypertension: Controlled; continue amlodipine * Breast cancer: Undergoing chemotherapy; last treatment June 6. Includes dexamethasone. Explains leukocytosis. * DVT prophylaxis: Therapeutic heparin * GI  prophylaxis: None   All the records are reviewed and case discussed with Care Management/Social Workerr. Management plans discussed with the patient, family and they are in agreement.  CODE STATUS: Full.  TOTAL TIME TAKING CARE OF THIS PATIENT: 35 minutes.    POSSIBLE D/C IN 1-2 DAYS, DEPENDING ON CLINICAL CONDITION.   Vaughan Basta M.D on 01/04/2017   Between 7am to 6pm - Pager - [phone]  After 6pm go to [email or web address] - password [email or web address] [facility]  Office  [phone]  CC: Primary care physician; System, Pcp Not In  Note: This dictation was prepared with Dragon dictation along with smaller phrase technology. Any transcriptional errors that result from this process are unintentional.

## 2017-01-04 NOTE — Progress Notes (Signed)
   01/04/17 1115  Clinical Encounter Type  Visited With Patient  Visit Type Initial  Referral From Other (Comment) (morning rounds)   Chaplain knocked and entered room. Patient was eating. Chaplain will visit another time.

## 2017-01-04 NOTE — Progress Notes (Signed)
ANTICOAGULATION CONSULT NOTE - Initial Consult  Pharmacy Consult for heparin dosing Indication: chest pain/ACS  No Known Allergies  Patient Measurements: Height: 5\' 6"  (167.6 cm) Weight: 205 lb 4.8 oz (93.1 kg) IBW/kg (Calculated) : 59.3 Heparin Dosing Weight: 94.3 kg  Vital Signs: Temp: 100.9 F (38.3 C) (06/19 1207) Temp Source: Oral (06/19 1207) BP: 127/62 (06/19 1207) Pulse Rate: 92 (06/19 1207)  Labs:  Recent Labs  01/04/17 0120 01/04/17 0440 01/04/17 0554 01/04/17 1038 01/04/17 1335  HGB 9.4*  --   --   --   --   HCT 28.4*  --   --   --   --   PLT 148*  --   --   --   --   APTT  --   --  143*  --   --   LABPROT  --   --  15.2  --   --   INR  --   --  1.19  --   --   HEPARINUNFRC  --   --  0.77*  --  0.54  CREATININE 1.47*  --   --   --   --   TROPONINI 0.27* <0.03  --  0.06*  --     Estimated Creatinine Clearance: 42.7 mL/min (A) (by C-G formula based on SCr of 1.47 mg/dL (H)).   Medical History: Past Medical History:  Diagnosis Date  . Breast CA ([place])   . HTN (hypertension)     Medications:  Scheduled:  . amLODipine  5 mg Oral Daily  . aspirin EC  81 mg Oral Daily  . atorvastatin  40 mg Oral q1800  . dexamethasone  8 mg Oral BID  . docusate sodium  100 mg Oral BID    Assessment: Pt. Admitted for syncope and found to have trops up to 0.27, second trop < 0.03. Starting heparin drip  Goal of Therapy:  Heparin level 0.3-0.7 units/ml Monitor platelets by anticoagulation protocol: Yes   Plan:  6/19 1335 HL 0.54 Will continue current rate of 1100 units/hr. Will recheck HL in 6 hours.

## 2017-01-04 NOTE — ED Notes (Addendum)
Pt ambulated without difficulty with stand by assist to in rm bathroom. RN remained in room for precautions.

## 2017-01-04 NOTE — ED Notes (Addendum)
Pt states she needs to use the bathroom again, pt states since she started chemo she has to use the bathroom often due to diarrhea. Pt is stand by assist to bathroom in rm  without difficulty. RN remained in rm for precaution.

## 2017-01-05 ENCOUNTER — Encounter: Payer: Self-pay | Admitting: Radiology

## 2017-01-05 ENCOUNTER — Ambulatory Visit: Payer: Medicare Other

## 2017-01-05 LAB — NM MYOCAR MULTI W/SPECT W/WALL MOTION / EF
CSEPED: 3 min
CSEPHR: 92 %
Estimated workload: 4.6 METS
Exercise duration (sec): 45 s
LV sys vol: 45 mL
LVDIAVOL: 112 mL (ref 46–106)
MPHR: 153 {beats}/min
Peak HR: 141 {beats}/min
Rest HR: 68 {beats}/min
SDS: 2
SRS: 4
SSS: 8
TID: 1.07

## 2017-01-05 LAB — HEMOGLOBIN A1C
Hgb A1c MFr Bld: 6.5 % — ABNORMAL HIGH (ref 4.8–5.6)
Mean Plasma Glucose: 140 mg/dL

## 2017-01-05 MED ORDER — ENOXAPARIN SODIUM 40 MG/0.4ML ~~LOC~~ SOLN
40.0000 mg | SUBCUTANEOUS | Status: DC
  Administered 2017-01-05: 40 mg via SUBCUTANEOUS
  Filled 2017-01-05: qty 0.4

## 2017-01-05 MED ORDER — ASPIRIN 81 MG PO TBEC: 81.0000 mg | DELAYED_RELEASE_TABLET | Freq: Every day | ORAL | Status: AC

## 2017-01-05 MED ORDER — SODIUM CHLORIDE 0.9% FLUSH
3.0000 mL | Freq: Two times a day (BID) | INTRAVENOUS | Status: DC
  Administered 2017-01-05 – 2017-01-06 (×2): 3 mL via INTRAVENOUS

## 2017-01-05 MED ORDER — TECHNETIUM TC 99M TETROFOSMIN IV KIT
13.0000 | PACK | Freq: Once | INTRAVENOUS | Status: AC | PRN
  Administered 2017-01-05: 11.6 via INTRAVENOUS

## 2017-01-05 MED ORDER — TECHNETIUM TC 99M TETROFOSMIN IV KIT
32.8800 | PACK | Freq: Once | INTRAVENOUS | Status: AC | PRN
  Administered 2017-01-05: 32.88 via INTRAVENOUS

## 2017-01-05 NOTE — Progress Notes (Signed)
Patient off the floor to radiology for lexiscan

## 2017-01-05 NOTE — Progress Notes (Signed)
[place] at [place] NAME: Traci Mckinney    MR#:  [id number]  DATE OF BIRTH:  21-May-1951  SUBJECTIVE:  CHIEF COMPLAINT:   Chief Complaint  Patient presents with  . Loss of Consciousness    History of breast cancer on chemotherapy, came with left-sided chest pain troponin was slightly elevated so admitted with IV heparin drip.   Also syncope likely due to dehydration.  No CP. Feels back to normal.  REVIEW OF SYSTEMS:  CONSTITUTIONAL: No fever, fatigue or weakness.  EYES: No blurred or double vision.  EARS, NOSE, AND THROAT: No tinnitus or ear pain.  RESPIRATORY: No cough, shortness of breath, wheezing or hemoptysis.  CARDIOVASCULAR: No chest pain, orthopnea, edema.  GASTROINTESTINAL: No nausea, vomiting, diarrhea or abdominal pain.  GENITOURINARY: No dysuria, hematuria.  ENDOCRINE: No polyuria, nocturia,  HEMATOLOGY: No anemia, easy bruising or bleeding SKIN: No rash or lesion. MUSCULOSKELETAL: No joint pain or arthritis.   NEUROLOGIC: No tingling, numbness, weakness.  PSYCHIATRY: No anxiety or depression.   ROS  DRUG ALLERGIES:  No Known Allergies  VITALS:  Blood pressure (!) 143/76, pulse 63, temperature 97.9 F (36.6 C), temperature source Oral, resp. rate 19, height 5\' 6"  (1.676 m), weight 92.9 kg (204 lb 14.4 oz), SpO2 97 %.  PHYSICAL EXAMINATION:  GENERAL:  66 y.o.-year-old patient lying in the bed with no acute distress.  EYES: Pupils equal, round, reactive to light and accommodation. No scleral icterus. Extraocular muscles intact.  HEENT: Head atraumatic, normocephalic. Oropharynx and nasopharynx clear.  NECK:  Supple, no jugular venous distention. No thyroid enlargement, no tenderness.  LUNGS: Normal breath sounds bilaterally, no wheezing, rales,rhonchi or crepitation. No use of accessory muscles of respiration.  CARDIOVASCULAR: S1, S2 normal. No murmurs, rubs, or gallops.  ABDOMEN: Soft, nontender, nondistended. Bowel  sounds present. No organomegaly or mass.  EXTREMITIES: No pedal edema, cyanosis, or clubbing.  NEUROLOGIC: Cranial nerves II through XII are intact. Muscle strength 5/5 in all extremities. Sensation intact. Gait not checked.  PSYCHIATRIC: The patient is alert and oriented x 3.  SKIN: No obvious rash, lesion, or ulcer.   Physical Exam LABORATORY PANEL:   CBC  Recent Labs Lab 01/04/17 0120  WBC 22.0*  HGB 9.4*  HCT 28.4*  PLT 148*   ------------------------------------------------------------------------------------------------------------------  Chemistries   Recent Labs Lab 01/04/17 0120  NA 135  K 4.0  CL 104  CO2 22  GLUCOSE 168*  BUN 17  CREATININE 1.47*  CALCIUM 8.6*  AST 24  ALT 17  ALKPHOS 95  BILITOT 1.0   ------------------------------------------------------------------------------------------------------------------  Cardiac Enzymes  Recent Labs Lab 01/04/17 1038 01/04/17 1649  TROPONINI 0.06* <0.03   ------------------------------------------------------------------------------------------------------------------  RADIOLOGY:  Dg Chest 2 View  Result Date: 01/04/2017 CLINICAL DATA:  Syncopal episode this morning. Patient is undergoing chemotherapy for breast malignancy. History of NSTEMI and hypertension. Nonsmoker. EXAM: CHEST  2 VIEW COMPARISON:  PET-CT study of November 08, 2016 and chest x-ray of September 17, 2016 FINDINGS: The lungs are adequately inflated. There are coarse lung markings at the left lung base. There is no pleural effusion or pneumothorax. The heart is top-normal in size. The pulmonary vascularity is normal. There is tortuosity of the ascending and descending thoracic aorta. The power port catheter tip projects over the proximal third of the SVC. There is multilevel degenerative disc disease with bridging endplate osteophytes of the mid and lower thoracic spine. IMPRESSION: There is no acute pneumonia nor CHF. I cannot exclude minimal  atelectasis at the left lung base. Thoracic aortic atherosclerosis. Electronically Signed   By: David  Martinique M.D.   On: 01/04/2017 08:37   Ct Head Wo Contrast  Result Date: 01/04/2017 CLINICAL DATA:  Initial evaluation for acute syncope. EXAM: CT HEAD WITHOUT CONTRAST TECHNIQUE: Contiguous axial images were obtained from the base of the skull through the vertex without intravenous contrast. COMPARISON:  None. FINDINGS: Brain: Cerebral volume within normal limits for patient age. No evidence for acute intracranial hemorrhage. No findings to suggest acute large vessel territory infarct. No mass lesion, midline shift, or mass effect. Ventricles are normal in size without evidence for hydrocephalus. No extra-axial fluid collection identified. Vascular: No hyperdense vessel identified. Skull: Scalp soft tissues demonstrate no acute abnormality.Calvarium intact. Sinuses/Orbits: Globes and orbital soft tissues are within normal limits. Visualized paranasal sinuses are clear. Trace left mastoid effusion noted. IMPRESSION: Normal head CT.  No acute intracranial process identified. Electronically Signed   By: Jeannine Boga M.D.   On: 01/04/2017 02:07   Nm Myocar Multi W/spect W/wall Motion / Ef  Result Date: 01/05/2017  Blood pressure demonstrated a normal response to exercise.  There was no ST segment deviation noted during stress.  Defect 1: There is a small defect of mild severity present in the apex location.  This is a low risk study.  The left ventricular ejection fraction is normal (55-65%).     ASSESSMENT AND PLAN:   Active Problems:   NSTEMI (non-ST elevated myocardial infarction) ([place])  * Syncope likely due to dehydration Had diarrhea with chemo and dark urine. Improved.  * Elevated troponin likely due to demand ischemia No significant elevation EKG _ nothing acute Stress test today shows no ischemic areas. Discussed with Dr. Stephani Police Was on heparin drip on day 1 and  stopped  *  Acute kidney injury: Hydrate with intravenous fluid. Avoid nephrotoxic agents.  * Essential hypertension: Controlled; continue amlodipine * Breast cancer: Undergoing chemotherapy; last treatment June  * Leucocytosis - Due to dexamethasone.  All the records are reviewed and case discussed with Care Management/Social Worker Management plans discussed with the patient, family and they are in agreement.  CODE STATUS: Full.  TOTAL TIME TAKING CARE OF THIS PATIENT: 35 minutes.    POSSIBLE D/C IN 1-2 DAYS, DEPENDING ON CLINICAL CONDITION.   Hillary Bow R M.D on 01/05/2017   Between 7am to 6pm - Pager - [phone]  After 6pm go to [email or web address] - password [email or web address] [facility]  Office  [phone]  CC: Primary care physician; System, Pcp Not In  Note: This dictation was prepared with Dragon dictation along with smaller phrase technology. Any transcriptional errors that result from this process are unintentional.

## 2017-01-05 NOTE — Progress Notes (Signed)
Patient resting in the the bed, denies any pain or discomfort, no dizziness or chest pain at this time

## 2017-01-05 NOTE — Progress Notes (Signed)
Patient returned to the floor at this time, remains alert and oriented, denies any pain, vss, patient resting in the room at this time

## 2017-01-05 NOTE — Plan of Care (Signed)
Problem: Pain Managment: Goal: General experience of comfort will improve Outcome: Progressing No complaints of pain this shift, will continue to monitor.  Problem: Activity: Goal: Risk for activity intolerance will decrease Outcome: Progressing Pt up OOB independently in room, tolerating well. Will continue to monitor.

## 2017-01-06 LAB — BASIC METABOLIC PANEL
Anion gap: 6 (ref 5–15)
BUN: 29 mg/dL — AB (ref 6–20)
CALCIUM: 9.7 mg/dL (ref 8.9–10.3)
CO2: 24 mmol/L (ref 22–32)
CREATININE: 1.3 mg/dL — AB (ref 0.44–1.00)
Chloride: 105 mmol/L (ref 101–111)
GFR calc Af Amer: 48 mL/min — ABNORMAL LOW (ref >60)
GFR, EST NON AFRICAN AMERICAN: 41 mL/min — AB (ref >60)
GLUCOSE: 119 mg/dL — AB (ref 65–99)
Potassium: 3.9 mmol/L (ref 3.5–5.1)
Sodium: 135 mmol/L (ref 135–145)

## 2017-01-06 MED ORDER — SODIUM CHLORIDE 0.9 % IV SOLN
INTRAVENOUS | Status: DC
  Administered 2017-01-06: 12:00:00 via INTRAVENOUS

## 2017-01-06 NOTE — Discharge Summary (Signed)
[facility] [place] NAME: Traci Mckinney    MR#:  [id number]  DATE OF BIRTH:  October 21, 1950  DATE OF ADMISSION:  01/04/2017 ADMITTING PHYSICIAN: Harrie Foreman, MD  DATE OF DISCHARGE:  01/06/17 PRIMARY CARE PHYSICIAN: System, Pcp Not In    ADMISSION DIAGNOSIS:  Elevated troponin [R74.8] Syncope, unspecified syncope type [R55]  DISCHARGE DIAGNOSIS:  Active Problems:   NSTEMI (non-ST elevated myocardial infarction) ([place]) Acute kidney injury Syncope SECONDARY DIAGNOSIS:   Past Medical History:  Diagnosis Date  . Breast CA ([place])   . HTN (hypertension)     HOSPITAL COURSE:   HPI: The patient with past medical history of hypertension who is currently undergoing treatment for breast cancer presents to the emergency department after a syncopal episode. The patient was sitting at her electronic gaming continue when she began to feel weak and lightheaded. The patient remembers that her vision became blurry but does not have recollection of events thereafter until she regained consciousness. She denies feeling chest pain, shortness of breath or palpitations. She also denies vomiting or diaphoresis but admits to feeling nauseous. In the emergency department the patient was found to have an elevated troponin which prompted the emergency department staff to call the hospitalist service for admission.   * Syncope likely due to dehydration Had diarrhea with chemo and dark urine. Improved with IVF  * Elevated troponin -non-STEMI No significant elevation EKG _ nothing acute Stress test today shows no ischemic areas,small apical defect but low risk study okay to discharge from cardiac standpoint Discussed with Dr. Stephani Police Was on heparin drip on day 1 and stopped  on aspirin  *  Acute kidney injury: Hydrate with intravenous fluid. Avoid nephrotoxic agents. Creatinine 1.49-1.30, patient will be discharged after 500 cc normal saline  infusion  * Essential hypertension: Controlled; continue amlodipine  * Breast cancer: Undergoing chemotherapy; last treatment June  * Leucocytosis - Due to dexamethasone.  DISCHARGE CONDITIONS:  Stable  CONSULTS OBTAINED:  Treatment Team:  Isaias Cowman, MD   PROCEDURES Myoview stress test small apical defect but low risk study okay to discharge from cardiac standpoint  DRUG ALLERGIES:  No Known Allergies  DISCHARGE MEDICATIONS:   Current Discharge Medication List    START taking these medications   Details  aspirin EC 81 MG EC tablet Take 1 tablet (81 mg total) by mouth daily.      CONTINUE these medications which have NOT CHANGED   Details  amLODipine (NORVASC) 5 MG tablet Take 5 mg by mouth daily.    dexamethasone (DECADRON) 4 MG tablet Take 8 mg by mouth 2 (two) times daily. The day before and day after chemotherapy    HYDROcodone-acetaminophen (NORCO/VICODIN) 5-325 MG tablet Take 1 tablet by mouth every 6 (six) hours as needed for pain.    LORazepam (ATIVAN) 1 MG tablet Take 1 mg by mouth every 6 (six) hours as needed for nausea/vomiting or sleep.    ondansetron (ZOFRAN) 8 MG tablet Take 8 mg by mouth every 6 (six) hours as needed for nausea.    prochlorperazine (COMPAZINE) 10 MG tablet Take 10 mg by mouth every 6 (six) hours as needed for nausea/vomiting.         DISCHARGE INSTRUCTIONS:  Follow-up with primary care physician in a week Follow-up with cardiology Dr. Saralyn Pilar  in a week  Follow-up with oncology as recommended DIET:  Cardiac diet  DISCHARGE CONDITION:  Stable  ACTIVITY:  Activity as tolerated  OXYGEN:  Home Oxygen: No.   Oxygen Delivery: room air  DISCHARGE LOCATION:  home   If you experience worsening of your admission symptoms, develop shortness of breath, life threatening emergency, suicidal or homicidal thoughts you must seek medical attention immediately by calling 911 or calling your MD immediately  if symptoms  less severe.  You Must read complete instructions/literature along with all the possible adverse reactions/side effects for all the Medicines you take and that have been prescribed to you. Take any new Medicines after you have completely understood and accpet all the possible adverse reactions/side effects.   Please note  You were cared for by a hospitalist during your hospital stay. If you have any questions about your discharge medications or the care you received while you were in the hospital after you are discharged, you can call the unit and asked to speak with the hospitalist on call if the hospitalist that took care of you is not available. Once you are discharged, your primary care physician will handle any further medical issues. Please note that NO REFILLS for any discharge medications will be authorized once you are discharged, as it is imperative that you return to your primary care physician (or establish a relationship with a primary care physician if you do not have one) for your aftercare needs so that they can reassess your need for medications and monitor your lab values.     Today  Chief Complaint  Patient presents with  . Loss of Consciousness   Patient denies any chest pain today. Feeling better  wants to go home  ROS:  CONSTITUTIONAL: Denies fevers, chills. Denies any fatigue, weakness.  EYES: Denies blurry vision, double vision, eye pain. EARS, NOSE, THROAT: Denies tinnitus, ear pain, hearing loss. RESPIRATORY: Denies cough, wheeze, shortness of breath.  CARDIOVASCULAR: Denies chest pain, palpitations, edema.  GASTROINTESTINAL: Denies nausea, vomiting, diarrhea, abdominal pain. Denies bright red blood per rectum. GENITOURINARY: Denies dysuria, hematuria. ENDOCRINE: Denies nocturia or thyroid problems. HEMATOLOGIC AND LYMPHATIC: Denies easy bruising or bleeding. SKIN: Denies rash or lesion. MUSCULOSKELETAL: Denies pain in neck, back, shoulder, knees, hips or  arthritic symptoms.  NEUROLOGIC: Denies paralysis, paresthesias.  PSYCHIATRIC: Denies anxiety or depressive symptoms.   VITAL SIGNS:  Blood pressure 133/71, pulse 64, temperature 97.9 F (36.6 C), temperature source Oral, resp. rate 16, height 5\' 6"  (1.676 m), weight 93.4 kg (206 lb), SpO2 100 %.  I/O:    Intake/Output Summary (Last 24 hours) at 01/06/17 1111 Last data filed at 01/06/17 0924  Gross per 24 hour  Intake              720 ml  Output             1200 ml  Net             -480 ml    PHYSICAL EXAMINATION:  GENERAL:  66 y.o.-year-old patient lying in the bed with no acute distress.  EYES: Pupils equal, round, reactive to light and accommodation. No scleral icterus. Extraocular muscles intact.  HEENT: Head atraumatic, normocephalic. Oropharynx and nasopharynx clear.  NECK:  Supple, no jugular venous distention. No thyroid enlargement, no tenderness.  LUNGS: Normal breath sounds bilaterally, no wheezing, rales,rhonchi or crepitation. No use of accessory muscles of respiration.  CARDIOVASCULAR: S1, S2 normal. No murmurs, rubs, or gallops.  ABDOMEN: Soft, non-tender, non-distended. Bowel sounds present. No organomegaly or mass.  EXTREMITIES: No pedal edema, cyanosis, or clubbing.  NEUROLOGIC: Cranial nerves II through XII are intact. Muscle strength 5/5  in all extremities. Sensation intact. Gait not checked.  PSYCHIATRIC: The patient is alert and oriented x 3.  SKIN: No obvious rash, lesion, or ulcer.   DATA REVIEW:   CBC  Recent Labs Lab 01/04/17 0120  WBC 22.0*  HGB 9.4*  HCT 28.4*  PLT 148*    Chemistries   Recent Labs Lab 01/04/17 0120 01/06/17 1017  NA 135 135  K 4.0 3.9  CL 104 105  CO2 22 24  GLUCOSE 168* 119*  BUN 17 29*  CREATININE 1.47* 1.30*  CALCIUM 8.6* 9.7  AST 24  --   ALT 17  --   ALKPHOS 95  --   BILITOT 1.0  --     Cardiac Enzymes  Recent Labs Lab 01/04/17 1649  TROPONINI <0.03    Microbiology Results  No results found  for this or any previous visit.  RADIOLOGY:  Dg Chest 2 View  Result Date: 01/04/2017 CLINICAL DATA:  Syncopal episode this morning. Patient is undergoing chemotherapy for breast malignancy. History of NSTEMI and hypertension. Nonsmoker. EXAM: CHEST  2 VIEW COMPARISON:  PET-CT study of November 08, 2016 and chest x-ray of September 17, 2016 FINDINGS: The lungs are adequately inflated. There are coarse lung markings at the left lung base. There is no pleural effusion or pneumothorax. The heart is top-normal in size. The pulmonary vascularity is normal. There is tortuosity of the ascending and descending thoracic aorta. The power port catheter tip projects over the proximal third of the SVC. There is multilevel degenerative disc disease with bridging endplate osteophytes of the mid and lower thoracic spine. IMPRESSION: There is no acute pneumonia nor CHF. I cannot exclude minimal atelectasis at the left lung base. Thoracic aortic atherosclerosis. Electronically Signed   By: David  Martinique M.D.   On: 01/04/2017 08:37   Ct Head Wo Contrast  Result Date: 01/04/2017 CLINICAL DATA:  Initial evaluation for acute syncope. EXAM: CT HEAD WITHOUT CONTRAST TECHNIQUE: Contiguous axial images were obtained from the base of the skull through the vertex without intravenous contrast. COMPARISON:  None. FINDINGS: Brain: Cerebral volume within normal limits for patient age. No evidence for acute intracranial hemorrhage. No findings to suggest acute large vessel territory infarct. No mass lesion, midline shift, or mass effect. Ventricles are normal in size without evidence for hydrocephalus. No extra-axial fluid collection identified. Vascular: No hyperdense vessel identified. Skull: Scalp soft tissues demonstrate no acute abnormality.Calvarium intact. Sinuses/Orbits: Globes and orbital soft tissues are within normal limits. Visualized paranasal sinuses are clear. Trace left mastoid effusion noted. IMPRESSION: Normal head CT.  No acute  intracranial process identified. Electronically Signed   By: Jeannine Boga M.D.   On: 01/04/2017 02:07   Nm Myocar Multi W/spect W/wall Motion / Ef  Result Date: 01/05/2017  Blood pressure demonstrated a normal response to exercise.  There was no ST segment deviation noted during stress.  Defect 1: There is a small defect of mild severity present in the apex location.  This is a low risk study.  The left ventricular ejection fraction is normal (55-65%).     EKG:   Orders placed or performed during the hospital encounter of 01/04/17  . ED EKG  . ED EKG      Management plans discussed with the patient, family and they are in agreement.  CODE STATUS:     Code Status Orders        Start     Ordered   01/04/17 0459  Full code  Continuous  01/04/17 0458    Code Status History    Date Active Date Inactive Code Status Order ID Comments User Context   This patient has a current code status but no historical code status.      TOTAL TIME TAKING CARE OF THIS PATIENT: 45  minutes.   Note: This dictation was prepared with Dragon dictation along with smaller phrase technology. Any transcriptional errors that result from this process are unintentional.   @[facility] @  on 01/06/2017 at 11:11 AM  Between 7am to 6pm - Pager - [phone]  After 6pm go to [email or web address] - password [email or web address] [facility]  Office  [phone]  CC: Primary care physician; System, Pcp Not In

## 2017-01-06 NOTE — Progress Notes (Signed)
[facility] Cardiology  SUBJECTIVE: Patient reports feeling well this morning. She denies chest pain, shortness of breath, palpitations, or recurrent dizziness, lightheadedness, or presyncope.   Vitals:   01/05/17 2003 01/06/17 0414 01/06/17 0453 01/06/17 0804  BP: (!) 143/76 (!) 141/64  133/71  Pulse: 63 (!) 50  64  Resp: 19 16    Temp: 97.9 F (36.6 C) 97.9 F (36.6 C)    TempSrc: Oral Oral    SpO2: 97% 100%  100%  Weight:   93.4 kg (206 lb)   Height:         Intake/Output Summary (Last 24 hours) at 01/06/17 4536 Last data filed at 01/06/17 0430  Gross per 24 hour  Intake              480 ml  Output             1200 ml  Net             -720 ml      PHYSICAL EXAM  General: Well developed, well nourished, in no acute distress HEENT:  Normocephalic and atramatic Neck:   No JVD.  Lungs: Clear bilaterally to auscultation, normal effort of breathing Heart: HRRR . Normal S1 and S2 without gallops or murmurs.  Abdomen: Bowel sounds are positive, abdomen soft and non-tender  Msk:  Back normal, normal gait, normal muscle tone for age Extremities: No clubbing, cyanosis or edema.   Neuro: Alert and oriented X 3. Psych:  Good affect, responds appropriately   LABS: Basic Metabolic Panel:  Recent Labs  01/04/17 0120  NA 135  K 4.0  CL 104  CO2 22  GLUCOSE 168*  BUN 17  CREATININE 1.47*  CALCIUM 8.6*   Liver Function Tests:  Recent Labs  01/04/17 0120  AST 24  ALT 17  ALKPHOS 95  BILITOT 1.0  PROT 6.8  ALBUMIN 3.4*   No results for input(s): LIPASE, AMYLASE in the last 72 hours. CBC:  Recent Labs  01/04/17 0120  WBC 22.0*  HGB 9.4*  HCT 28.4*  MCV 92.0  PLT 148*   Cardiac Enzymes:  Recent Labs  01/04/17 0440 01/04/17 1038 01/04/17 1649  TROPONINI <0.03 0.06* <0.03   BNP: Invalid input(s): POCBNP D-Dimer: No results for input(s): DDIMER in the last 72 hours. Hemoglobin A1C:  Recent Labs  01/04/17 0554  HGBA1C 6.5*   Fasting Lipid  Panel:  Recent Labs  01/04/17 1038  CHOL 144  HDL 39*  LDLCALC 92  TRIG 63  CHOLHDL 3.7   Thyroid Function Tests:  Recent Labs  01/04/17 0554  TSH 1.619   Anemia Panel: No results for input(s): VITAMINB12, FOLATE, FERRITIN, TIBC, IRON, RETICCTPCT in the last 72 hours.  Nm Myocar Multi W/spect W/wall Motion / Ef  Result Date: 01/05/2017  Blood pressure demonstrated a normal response to exercise.  There was no ST segment deviation noted during stress.  Defect 1: There is a small defect of mild severity present in the apex location.  This is a low risk study.  The left ventricular ejection fraction is normal (55-65%).     TELEMETRY: Sinus rhythm  ASSESSMENT AND PLAN:  Active Problems:   NSTEMI (non-ST elevated myocardial infarction) ([place])    1. Syncope, likely due to dehydration due to recent diarrhea, no recurrence of lightheadedness, presyncope, or syncope. 2. Borderline elevated troponin of 0.06, in the absence of chest pain with minimal ST elevation in inferior leads. Patient underwent Lexiscan Myoview yesterday, revealing LV EF 55-65%,  no ECG changes. There was a small, mild apical defect.    3. Breast cancer, currently undergoing chemotherapy  Recommendations: 1. Agree with current therapy 2. No further cardiac diagnostics at this time. 3. Recommend follow-up with Dr. Saralyn Pilar as outpatient in 1 week.  Sign off for now; call with any questions.  Clabe Seal, PA-C  01/06/2017 9:39 AM

## 2017-01-06 NOTE — Discharge Instructions (Signed)
Follow-up with primary care physician in a week Follow-up with cardiology Dr. Saralyn Pilar  in a week   Follow-up with oncology as recommended

## 2017-01-06 NOTE — Progress Notes (Signed)
Patient is discharged to home. Friend will drive patient back home in [place]. Patient was escorted out via wheelchair by nursing staff.

## 2019-05-25 IMAGING — CT CT HEAD W/O CM
3 series · 16 of 45 positions shown, 19 images · non-contrast
Comparison: None.

CLINICAL DATA: Initial evaluation for acute syncope.

EXAM:
CT HEAD WITHOUT CONTRAST
TECHNIQUE: Contiguous axial images were obtained from the base of the skull
through the vertex without intravenous contrast.

[Series 4: head wo · axial · 0.44mm/px · z∈[-138,-23]mm · 10 of 28 slices shown, 13 images]
[im 3/28  brain]
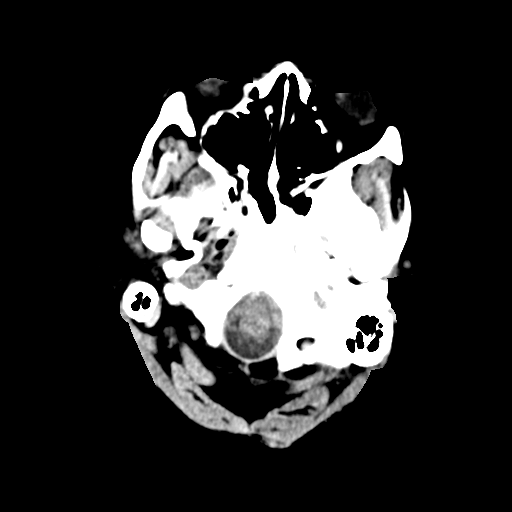
[im 3/28  bone]
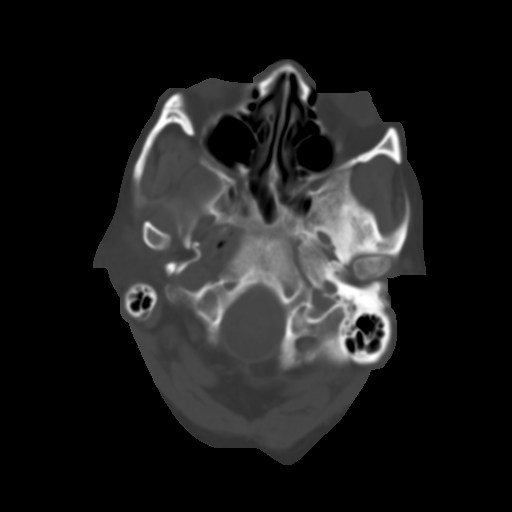
[im 5/28  brain]
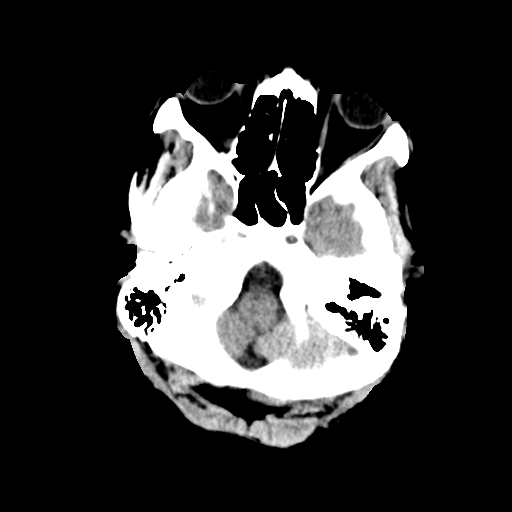
[im 8/28  brain]
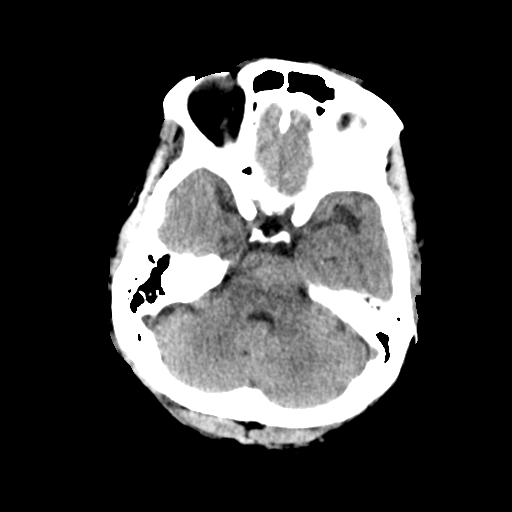
[im 11/28  brain]
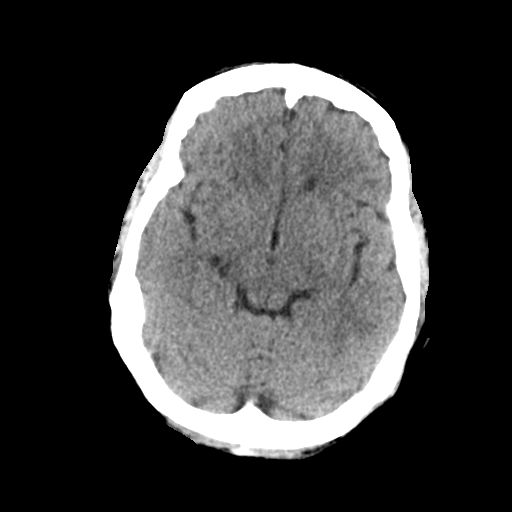
[im 13/28  brain]
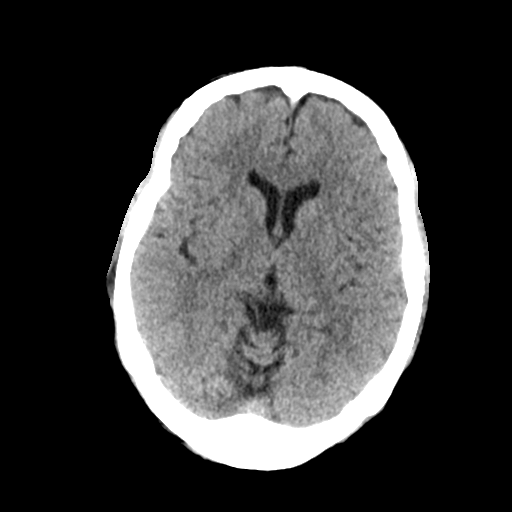
[im 13/28  bone]
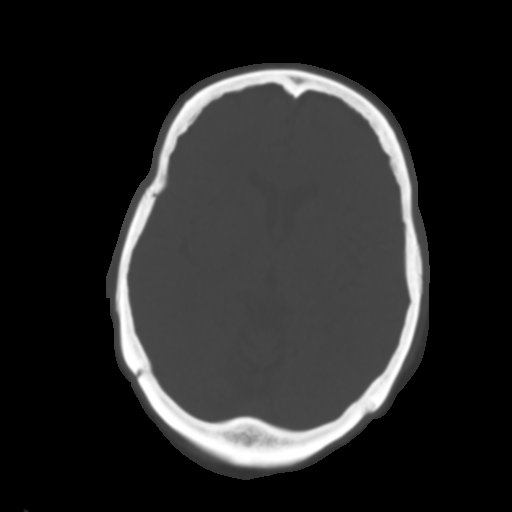
[im 16/28  brain]
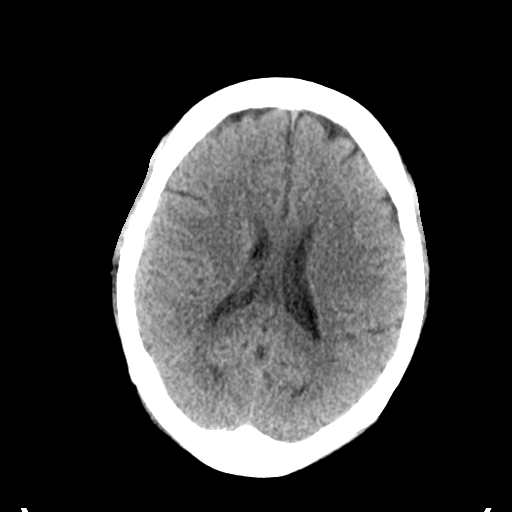
[im 18/28  brain]
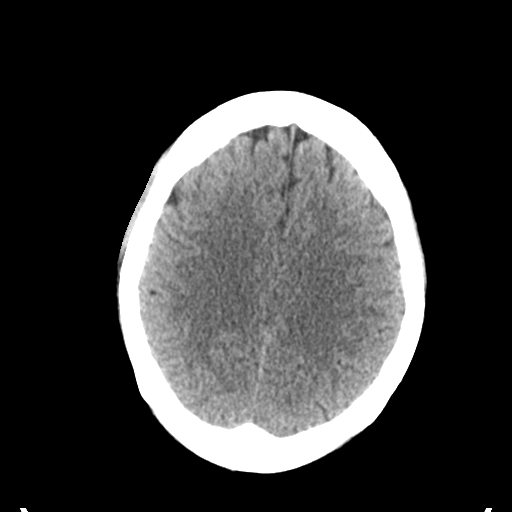
[im 21/28  brain]
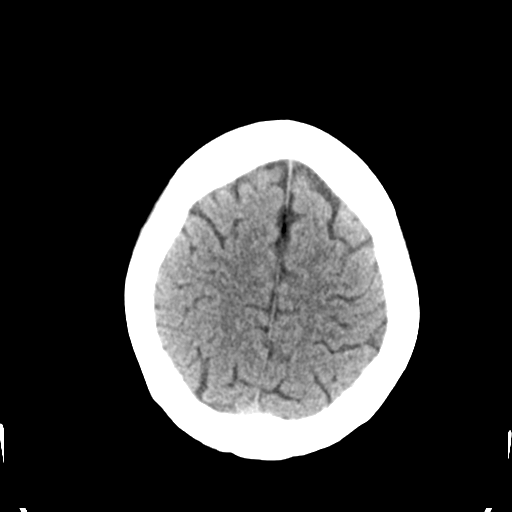
[im 24/28  brain]
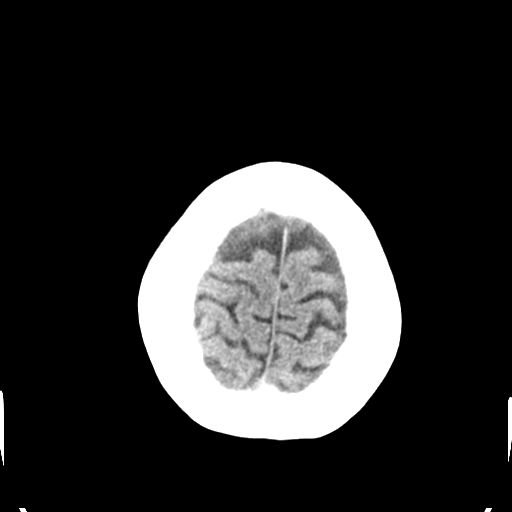
[im 24/28  bone]
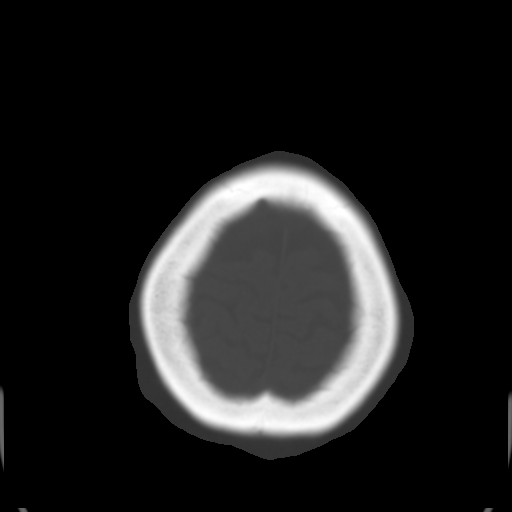
[im 26/28  brain]
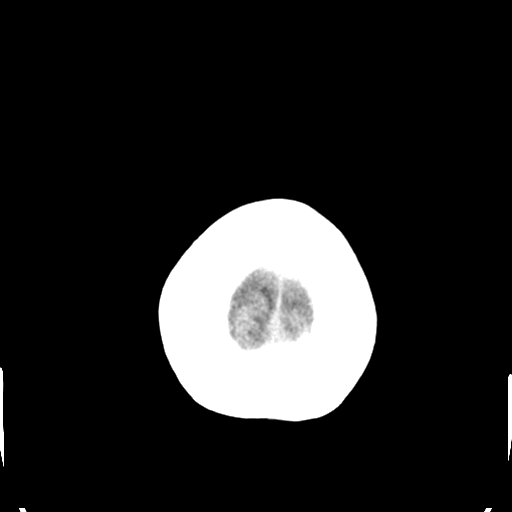

[Series 5: coronal soft tissue · coronal · 0.38mm/px · 3 of 67 slices shown]
[im 23/67  brain]
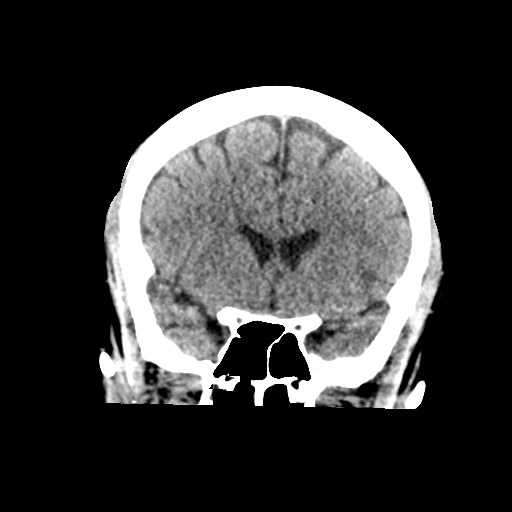
[im 30/67  brain]
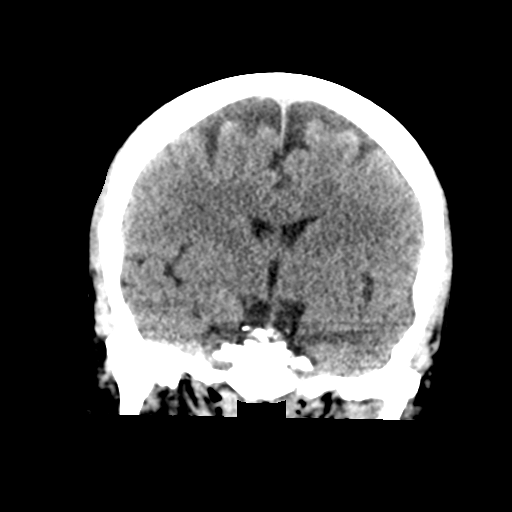
[im 37/67  brain]
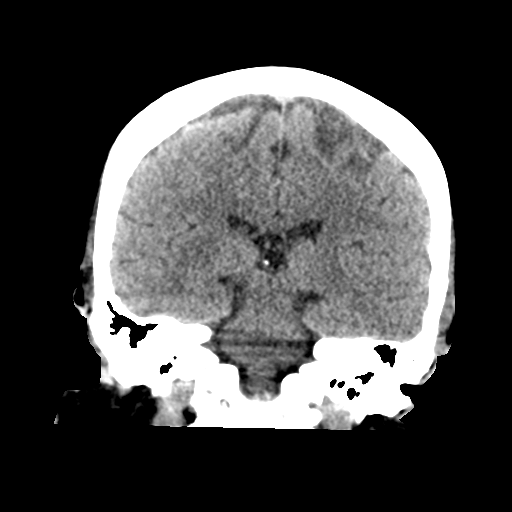

[Series 6: sagittal soft tissue · sagittal · 0.37mm/px · 3 of 67 slices shown]
[im 23/67  brain]
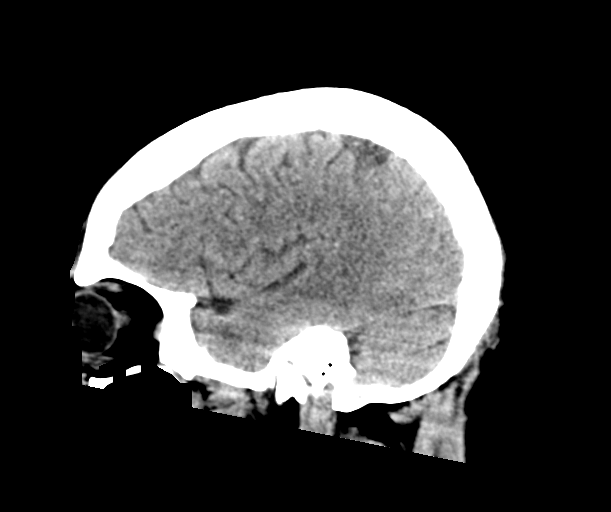
[im 34/67  brain]
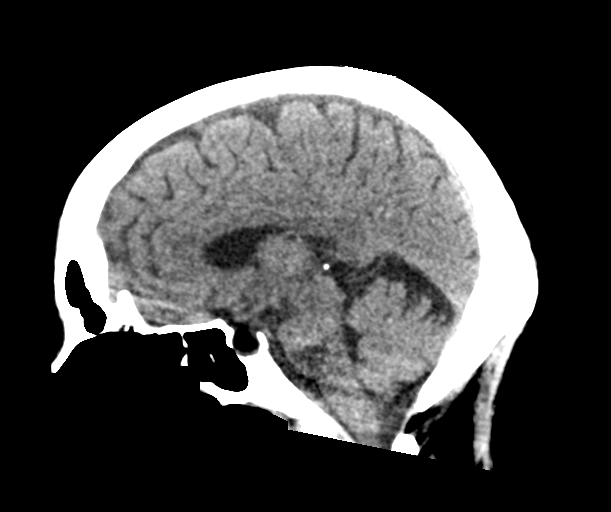
[im 45/67  brain]
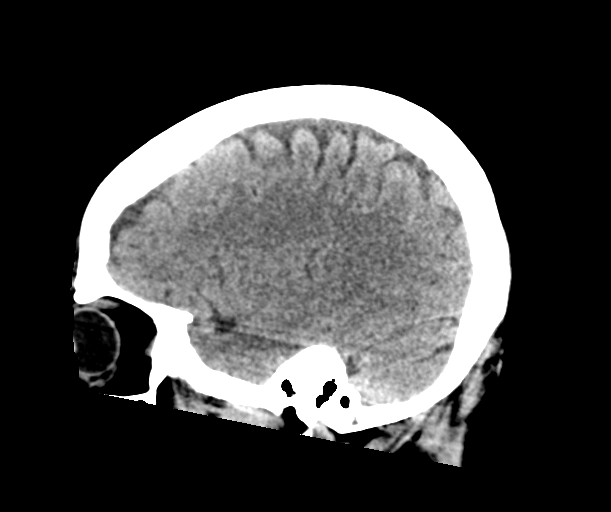

[16 of 45 positions shown; findings below may reference images not displayed]

FINDINGS: Brain: Cerebral volume within normal limits for patient age.

No evidence for acute intracranial hemorrhage. No findings to
suggest acute large vessel territory infarct. No mass lesion,
midline shift, or mass effect. Ventricles are normal in size without
evidence for hydrocephalus. No extra-axial fluid collection
identified.

Vascular: No hyperdense vessel identified.

Skull: Scalp soft tissues demonstrate no acute abnormality.Calvarium
intact.

Sinuses/Orbits: Globes and orbital soft tissues are within normal
limits.

Visualized paranasal sinuses are clear. Trace left mastoid effusion
noted.
IMPRESSION: Normal head CT.  No acute intracranial process identified.

## 2022-05-20 ENCOUNTER — Encounter (HOSPITAL_BASED_OUTPATIENT_CLINIC_OR_DEPARTMENT_OTHER): Payer: Self-pay | Admitting: Emergency Medicine

## 2022-05-20 ENCOUNTER — Emergency Department (HOSPITAL_BASED_OUTPATIENT_CLINIC_OR_DEPARTMENT_OTHER)
Admission: EM | Admit: 2022-05-20 | Discharge: 2022-05-20 | Disposition: A | Discharge status: Home / Self Care | Payer: Medicare Other | Attending: Emergency Medicine | Admitting: Emergency Medicine

## 2022-05-20 ENCOUNTER — Emergency Department (HOSPITAL_BASED_OUTPATIENT_CLINIC_OR_DEPARTMENT_OTHER): Payer: Medicare Other

## 2022-05-20 ENCOUNTER — Other Ambulatory Visit: Payer: Self-pay

## 2022-05-20 DIAGNOSIS — M25512 Pain in left shoulder: Secondary | ICD-10-CM | POA: Diagnosis not present

## 2022-05-20 DIAGNOSIS — Z853 Personal history of malignant neoplasm of breast: Secondary | ICD-10-CM | POA: Insufficient documentation

## 2022-05-20 DIAGNOSIS — Z79899 Other long term (current) drug therapy: Secondary | ICD-10-CM | POA: Insufficient documentation

## 2022-05-20 DIAGNOSIS — Z7982 Long term (current) use of aspirin: Secondary | ICD-10-CM | POA: Insufficient documentation

## 2022-05-20 DIAGNOSIS — M79671 Pain in right foot: Secondary | ICD-10-CM | POA: Insufficient documentation

## 2022-05-20 DIAGNOSIS — Y92410 Unspecified street and highway as the place of occurrence of the external cause: Secondary | ICD-10-CM | POA: Insufficient documentation

## 2022-05-20 DIAGNOSIS — M542 Cervicalgia: Secondary | ICD-10-CM | POA: Insufficient documentation

## 2022-05-20 DIAGNOSIS — I10 Essential (primary) hypertension: Secondary | ICD-10-CM | POA: Insufficient documentation

## 2022-05-20 DIAGNOSIS — R519 Headache, unspecified: Secondary | ICD-10-CM | POA: Insufficient documentation

## 2022-05-20 MED ORDER — HYDROCODONE-ACETAMINOPHEN 5-325 MG PO TABS
2.0000 | ORAL_TABLET | Freq: Once | ORAL | Status: AC
  Administered 2022-05-20: 1 via ORAL
  Filled 2022-05-20: qty 2
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
## 2022-05-21 NOTE — ED Notes (Signed)
Hydrocodone 5 mg/Acetaminophen 325 mg return to pyxis, 1 tab, by [employer], only 1 tab given yesterday but 2 tabs taken out by [employer]
